# Patient Record
Sex: Female | Born: 1975 | Race: White | Hispanic: No | Marital: Married | Smoking: Never smoker
Health system: Southern US, Community
[De-identification: ages and names within clinical notes are randomized; demographics above are authoritative.]

## PROBLEM LIST (undated history)

## (undated) DIAGNOSIS — G43909 Migraine, unspecified, not intractable, without status migrainosus: Secondary | ICD-10-CM

---

## 1999-03-29 ENCOUNTER — Other Ambulatory Visit: Admission: RE | Admit: 1999-03-29 | Discharge: 1999-03-29 | Payer: Self-pay | Admitting: Family Medicine

## 2000-02-14 ENCOUNTER — Other Ambulatory Visit: Admission: RE | Admit: 2000-02-14 | Discharge: 2000-02-14 | Payer: Self-pay | Admitting: Obstetrics and Gynecology

## 2000-07-08 ENCOUNTER — Encounter: Payer: Self-pay | Admitting: Obstetrics and Gynecology

## 2000-07-08 ENCOUNTER — Ambulatory Visit (HOSPITAL_COMMUNITY): Admission: RE | Admit: 2000-07-08 | Discharge: 2000-07-08 | Payer: Self-pay | Admitting: Obstetrics and Gynecology

## 2000-08-01 ENCOUNTER — Encounter (INDEPENDENT_AMBULATORY_CARE_PROVIDER_SITE_OTHER): Payer: Self-pay | Admitting: Specialist

## 2000-08-01 ENCOUNTER — Other Ambulatory Visit: Admission: RE | Admit: 2000-08-01 | Discharge: 2000-08-01 | Payer: Self-pay | Admitting: Obstetrics and Gynecology

## 2003-01-30 ENCOUNTER — Emergency Department (HOSPITAL_COMMUNITY): Admission: AD | Admit: 2003-01-30 | Discharge: 2003-01-30 | Payer: Self-pay | Admitting: Family Medicine

## 2003-03-19 ENCOUNTER — Other Ambulatory Visit: Admission: RE | Admit: 2003-03-19 | Discharge: 2003-03-19 | Payer: Self-pay | Admitting: Obstetrics and Gynecology

## 2003-09-06 ENCOUNTER — Encounter: Admission: RE | Admit: 2003-09-06 | Discharge: 2003-09-06 | Payer: Self-pay | Admitting: Obstetrics and Gynecology

## 2003-09-22 ENCOUNTER — Inpatient Hospital Stay (HOSPITAL_COMMUNITY): Admission: AD | Admit: 2003-09-22 | Discharge: 2003-09-22 | Payer: Self-pay | Admitting: Obstetrics and Gynecology

## 2003-10-03 ENCOUNTER — Inpatient Hospital Stay (HOSPITAL_COMMUNITY): Admission: AD | Admit: 2003-10-03 | Discharge: 2003-10-06 | Payer: Self-pay | Admitting: Obstetrics and Gynecology

## 2004-01-07 ENCOUNTER — Encounter: Admission: RE | Admit: 2004-01-07 | Discharge: 2004-02-06 | Payer: Self-pay | Admitting: Obstetrics and Gynecology

## 2004-03-08 ENCOUNTER — Encounter: Admission: RE | Admit: 2004-03-08 | Discharge: 2004-04-07 | Payer: Self-pay | Admitting: Obstetrics and Gynecology

## 2004-04-08 ENCOUNTER — Encounter: Admission: RE | Admit: 2004-04-08 | Discharge: 2004-05-03 | Payer: Self-pay | Admitting: Obstetrics and Gynecology

## 2004-08-31 ENCOUNTER — Encounter: Admission: RE | Admit: 2004-08-31 | Discharge: 2004-08-31 | Payer: Self-pay | Admitting: Family Medicine

## 2005-02-01 ENCOUNTER — Other Ambulatory Visit: Admission: RE | Admit: 2005-02-01 | Discharge: 2005-02-01 | Payer: Self-pay | Admitting: Family Medicine

## 2006-02-25 ENCOUNTER — Encounter: Admission: RE | Admit: 2006-02-25 | Discharge: 2006-03-10 | Payer: Self-pay | Admitting: Family Medicine

## 2006-04-25 ENCOUNTER — Other Ambulatory Visit: Admission: RE | Admit: 2006-04-25 | Discharge: 2006-04-25 | Payer: Self-pay | Admitting: Family Medicine

## 2007-05-16 ENCOUNTER — Other Ambulatory Visit: Admission: RE | Admit: 2007-05-16 | Discharge: 2007-05-16 | Payer: Self-pay | Admitting: Family Medicine

## 2008-06-17 ENCOUNTER — Other Ambulatory Visit: Admission: RE | Admit: 2008-06-17 | Discharge: 2008-06-17 | Payer: Self-pay | Admitting: Family Medicine

## 2009-07-15 ENCOUNTER — Other Ambulatory Visit: Admission: RE | Admit: 2009-07-15 | Discharge: 2009-07-15 | Payer: Self-pay | Admitting: Family Medicine

## 2009-11-01 ENCOUNTER — Encounter
Admission: RE | Admit: 2009-11-01 | Discharge: 2009-11-01 | Payer: Self-pay | Source: Home / Self Care | Attending: Physical Medicine and Rehabilitation | Admitting: Physical Medicine and Rehabilitation

## 2010-07-07 NOTE — Discharge Summary (Signed)
Alyssa Combs, Alyssa Combs                         ACCOUNT NO.:  [id number]   MEDICAL RECORD NO.:  [id number]                   PATIENT TYPE:  INP   LOCATION:  9115                                 FACILITY:  [facility]   PHYSICIAN:  Maxie Better, M.D.            DATE OF BIRTH:  [birth year]   DATE OF ADMISSION:  10/03/2003  DATE OF DISCHARGE:  10/06/2003                                 DISCHARGE SUMMARY   ADMISSION DIAGNOSES:  1.  Spontaneous rupture of membranes.  2.  Intrauterine gestation at 59 and six-sevenths weeks.  3.  Iron deficiency anemia.  4.  Hypokalemia.  5.  Early labor.  6.  Pregnancy-induced hypertension.   DISCHARGE DIAGNOSES:  1.  Intrauterine gestation at 37 weeks, delivered.  2.  Hypokalemia.  3.  Pregnancy-induced hypertension.  4.  Iron deficiency anemia.   PROCEDURE:  Vaginal delivery, spontaneous.   HISTORY OF PRESENT ILLNESS:  A 35 year old gravida 1 para 0 female at 67 and  six-sevenths weeks admitted on October 03, 2003 after she presented with  spontaneous rupture of membranes in early labor.  Group B strep culture was  negative.  She had an uncomplicated prenatal course.  Ultrasound was notable  for large-for-gestational-age baby with an estimated fetal weight on her  last ultrasound was 6 pounds 9 ounces per the patient's report.  Blood type  is B positive, antibody screen was negative, rubella was immune, hepatitis B  surface antigen is negative.   HOSPITAL COURSE:  The patient was admitted.  Her blood pressure was noted to  be 160/87.  She was an anxious, gravid female.  Temperature was 98.2.  Her  exam was notable for abdomen gravid with mild palpable contractions, grossly  ruptured membranes, clear fluid.  Digital exam:  She was a tight 2 cm, 90%,  -2, vertex.  Extremities had trace edema.  Tracing showed baseline fetal  heart rate of 140, reactivity, variability, some decelerations, some  irregular contractions.  The patient was transferred to  labor and delivery.  PIH labs were obtained subsequently notable for hemoglobin of 10.1;  hematocrit 28.9; platelet count of 156,000.  Her potassium was 2.7, BUN was  3, and her uric acid was 3.1.  Potassium repletion was started.  The patient  subsequently received an epidural.  She reported a history of having some  vomiting.  No prior history of problems with a low potassium.  She  progressed well after epidural, pushed well, and subsequently had a vaginal  delivery of a live female over a small second degree perineal laceration,  Apgars of 8 and 9.  Weight of the baby was 7 pounds 6 ounces.  Cord around  the next x1 reducible.  Her lacerations were repaired.  She had increased  blood loss secondary to uterine atony.  This was treated with bimanual  massage, Pitocin dosage, Hemabate x2.  The placenta was intact.  The patient  reported history of  blood transfusion with the onset of menarche.  The  estimated blood loss was about 1000 mL.  Postpartum, the patient was not  able to tolerate the intravenous potassium replacement.  Oral potassium was  given.  She was started on iron supplementation twice a day.  Her postpartum  day #1 hemoglobin was 7.6; hematocrit 21.7; platelet count 133,000; white  count was 9.8.  Her blood pressure was slightly elevated, with range up to  141/91.  The patient had no other symptoms to suggest preeclampsia.  She had  subsequent blood pressures of 160s over 90s.  Her primary care obstetrician,  Dr. Annabell Howells, decided to have the patient remain in the hospital to watch her  blood pressures.  On October 06, 2003 with a repeat [facility] laboratory  essentially not that changed, her potassium increased to 3.2, the patient  was discharged home.   DISPOSITION:  Home.   CONDITION:  Stable.   DISCHARGE MEDICATIONS:  1.  K-Dur 20 mEq daily.  2.  She was started on labetalol 100 mg p.o. b.i.d.  with home health nurse      to check the blood pressure.  3.  Tylox one p.o.  q.6-8h. p.r.n. pain.  4.  Prenatal vitamins one p.o. daily.  5.  Iron supplementation one p.o. b.i.d.   FOLLOW-UP APPOINTMENT:  At [facility] for blood pressure check and  repeat potassium in a couple of days.  Otherwise, postpartum visit in 4-6  weeks.   DISCHARGE INSTRUCTIONS:  Per postpartum booklet given and PIH warning signs  reiterated.                                               Maxie Better, M.D.    Henry/MEDQ  D:  10/19/2003  T:  10/20/2003  Job:  [id number]

## 2010-10-16 ENCOUNTER — Other Ambulatory Visit (HOSPITAL_COMMUNITY)
Admission: RE | Admit: 2010-10-16 | Discharge: 2010-10-16 | Disposition: A | Discharge status: Home / Self Care | Payer: 59 | Source: Ambulatory Visit | Attending: Family Medicine | Admitting: Family Medicine

## 2010-10-16 ENCOUNTER — Other Ambulatory Visit: Payer: Self-pay | Admitting: Family Medicine

## 2010-10-16 DIAGNOSIS — Z124 Encounter for screening for malignant neoplasm of cervix: Secondary | ICD-10-CM | POA: Insufficient documentation

## 2013-04-07 ENCOUNTER — Emergency Department (HOSPITAL_BASED_OUTPATIENT_CLINIC_OR_DEPARTMENT_OTHER)
Admission: EM | Admit: 2013-04-07 | Discharge: 2013-04-07 | Disposition: A | Discharge status: Home / Self Care | Payer: 59 | Attending: Emergency Medicine | Admitting: Emergency Medicine

## 2013-04-07 ENCOUNTER — Encounter (HOSPITAL_BASED_OUTPATIENT_CLINIC_OR_DEPARTMENT_OTHER): Payer: Self-pay | Admitting: Emergency Medicine

## 2013-04-07 DIAGNOSIS — Y9389 Activity, other specified: Secondary | ICD-10-CM | POA: Insufficient documentation

## 2013-04-07 DIAGNOSIS — S335XXA Sprain of ligaments of lumbar spine, initial encounter: Secondary | ICD-10-CM | POA: Insufficient documentation

## 2013-04-07 DIAGNOSIS — Y929 Unspecified place or not applicable: Secondary | ICD-10-CM | POA: Insufficient documentation

## 2013-04-07 DIAGNOSIS — X500XXA Overexertion from strenuous movement or load, initial encounter: Secondary | ICD-10-CM | POA: Insufficient documentation

## 2013-04-07 DIAGNOSIS — T148XXA Other injury of unspecified body region, initial encounter: Secondary | ICD-10-CM

## 2013-04-07 MED ORDER — HYDROCODONE-ACETAMINOPHEN 5-325 MG PO TABS: 2.0000 | ORAL_TABLET | ORAL | Status: AC | PRN

## 2013-04-07 MED ORDER — METHOCARBAMOL 500 MG PO TABS: 500.0000 mg | ORAL_TABLET | Freq: Two times a day (BID) | ORAL | Status: AC

## 2013-04-07 NOTE — ED Notes (Signed)
Pt teary, states "she barely looked at me! How can she tell this is just a pulled muscle? I am not happy!" pt asked if she would like to speak with PA or MD again to voice her concerns, pt states "No. I just want to get out of here." pt walked to check out desk and to pharmacy by this rn, all questions answered, service recovery attempted.

## 2013-04-07 NOTE — Discharge Instructions (Signed)
Back Pain, Adult Low back pain is very common. About 1 in 5 people have back pain.The cause of low back pain is rarely dangerous. The pain often gets better over time.About half of people with a sudden onset of back pain feel better in just 2 weeks. About 8 in 10 people feel better by 6 weeks.  CAUSES Some common causes of back pain include:  Strain of the muscles or ligaments supporting the spine.  Wear and tear (degeneration) of the spinal discs.  Arthritis.  Direct injury to the back. DIAGNOSIS Most of the time, the direct cause of low back pain is not known.However, back pain can be treated effectively even when the exact cause of the pain is unknown.Answering your caregiver's questions about your overall health and symptoms is one of the most accurate ways to make sure the cause of your pain is not dangerous. If your caregiver needs more information, he or she may order lab work or imaging tests (X-rays or MRIs).However, even if imaging tests show changes in your back, this usually does not require surgery. HOME CARE INSTRUCTIONS For many people, back pain returns.Since low back pain is rarely dangerous, it is often a condition that people can learn to manageon their own.   Remain active. It is stressful on the back to sit or stand in one place. Do not sit, drive, or stand in one place for more than 30 minutes at a time. Take short walks on level surfaces as soon as pain allows.Try to increase the length of time you walk each day.  Do not stay in bed.Resting more than 1 or 2 days can delay your recovery.  Do not avoid exercise or work.Your body is made to move.It is not dangerous to be active, even though your back may hurt.Your back will likely heal faster if you return to being active before your pain is gone.  Pay attention to your body when you bend and lift. Many people have less discomfortwhen lifting if they bend their knees, keep the load close to their bodies,and  avoid twisting. Often, the most comfortable positions are those that put less stress on your recovering back.  Find a comfortable position to sleep. Use a firm mattress and lie on your side with your knees slightly bent. If you lie on your back, put a pillow under your knees.  Only take over-the-counter or prescription medicines as directed by your caregiver. Over-the-counter medicines to reduce pain and inflammation are often the most helpful.Your caregiver may prescribe muscle relaxant drugs.These medicines help dull your pain so you can more quickly return to your normal activities and healthy exercise.  Put ice on the injured area.  Put ice in a plastic bag.  Place a towel between your skin and the bag.  Leave the ice on for 15-20 minutes, 03-04 times a day for the first 2 to 3 days. After that, ice and heat may be alternated to reduce pain and spasms.  Ask your caregiver about trying back exercises and gentle massage. This may be of some benefit.  Avoid feeling anxious or stressed.Stress increases muscle tension and can worsen back pain.It is important to recognize when you are anxious or stressed and learn ways to manage it.Exercise is a great option. SEEK MEDICAL CARE IF:  You have pain that is not relieved with rest or medicine.  You have pain that does not improve in 1 week.  You have new symptoms.  You are generally not feeling well. SEEK   IMMEDIATE MEDICAL CARE IF:   You have pain that radiates from your back into your legs.  You develop new bowel or bladder control problems.  You have unusual weakness or numbness in your arms or legs.  You develop nausea or vomiting.  You develop abdominal pain.  You feel faint. Document Released: 02/05/2005 Document Revised: 08/07/2011 Document Reviewed: 06/26/2010 [employer] Patient Information 2014 [employer].  

## 2013-04-07 NOTE — ED Notes (Signed)
Back pain. She was putting away groceries and had a pop in her lower back. Has been using a heating pad with not much relief.

## 2013-04-07 NOTE — ED Provider Notes (Signed)
CSN: [id number]     Arrival date & time 04/07/13  1352 History   First MD Initiated Contact with Patient 04/07/13 1540     Chief Complaint  Patient presents with  . Back Pain     (Consider location/radiation/quality/duration/timing/severity/associated sxs/prior Treatment) Patient is a 38 y.o. female presenting with back pain. The history is provided by the patient. No language interpreter was used.  Back Pain Location:  Lumbar spine Quality:  Aching Radiates to:  Does not radiate Pain severity:  Moderate Pain is:  Same all the time Onset quality:  Sudden Duration:  1 day Timing:  Constant Progression:  Worsening Chronicity:  New Relieved by:  Nothing Worsened by:  Nothing tried Ineffective treatments:  Ibuprofen and heating pad Associated symptoms: no abdominal pain, no numbness and no tingling     History reviewed. No pertinent past medical history. History reviewed. No pertinent past surgical history. No family history on file. History  Substance Use Topics  . Smoking status: Never Smoker   . Smokeless tobacco: Not on file  . Alcohol Use: No   OB History   Grav Para Term Preterm Abortions TAB SAB Ect Mult Living                 Review of Systems  Gastrointestinal: Negative for abdominal pain.  Musculoskeletal: Positive for back pain.  Neurological: Negative for tingling and numbness.  All other systems reviewed and are negative.      Allergies  Percocet  Home Medications  No current outpatient prescriptions on file. BP 116/85  Pulse 120  Temp(Src) 98 F (36.7 C) (Oral)  SpO2 99%  LMP 03/17/2013 Physical Exam  Nursing note and vitals reviewed. Constitutional: She is oriented to person, place, and time. She appears well-developed and well-nourished.  HENT:  Head: Normocephalic.  Eyes: EOM are normal.  Neck: Normal range of motion.  Pulmonary/Chest: Effort normal.  Abdominal: She exhibits no distension.  Musculoskeletal: She exhibits tenderness.   Tender left lower back sacral,  Tender to palpation pain with range of motion  Neurological: She is alert and oriented to person, place, and time.  Psychiatric: She has a normal mood and affect.    ED Course  Procedures (including critical care time) Labs Review Labs Reviewed - No data to display Imaging Review No results found.  EKG Interpretation   None       MDM   Final diagnoses:  Muscle strain    Robaxin and hydrocodone    Elson AreasLeslie K Sofia, PA-C 04/07/13 1614

## 2013-04-07 NOTE — ED Notes (Signed)
PA at bedside.

## 2013-04-08 NOTE — ED Provider Notes (Signed)
History/physical exam/procedure(s) were performed by non-physician practitioner and as supervising physician I was immediately available for consultation/collaboration. I have reviewed all notes and am in agreement with care and plan.   Landis Cassaro S Robert Sperl, MD 04/08/13 1309 

## 2016-12-28 ENCOUNTER — Emergency Department (HOSPITAL_BASED_OUTPATIENT_CLINIC_OR_DEPARTMENT_OTHER)
Admission: EM | Admit: 2016-12-28 | Discharge: 2016-12-28 | Disposition: A | Discharge status: Home / Self Care | Attending: Emergency Medicine | Admitting: Emergency Medicine

## 2016-12-28 ENCOUNTER — Emergency Department (HOSPITAL_BASED_OUTPATIENT_CLINIC_OR_DEPARTMENT_OTHER)

## 2016-12-28 ENCOUNTER — Other Ambulatory Visit: Payer: Self-pay

## 2016-12-28 ENCOUNTER — Encounter (HOSPITAL_BASED_OUTPATIENT_CLINIC_OR_DEPARTMENT_OTHER): Payer: Self-pay | Admitting: Adult Health

## 2016-12-28 DIAGNOSIS — S161XXA Strain of muscle, fascia and tendon at neck level, initial encounter: Secondary | ICD-10-CM | POA: Diagnosis not present

## 2016-12-28 DIAGNOSIS — Z79899 Other long term (current) drug therapy: Secondary | ICD-10-CM | POA: Insufficient documentation

## 2016-12-28 DIAGNOSIS — S0083XA Contusion of other part of head, initial encounter: Secondary | ICD-10-CM | POA: Insufficient documentation

## 2016-12-28 DIAGNOSIS — S0990XA Unspecified injury of head, initial encounter: Secondary | ICD-10-CM | POA: Insufficient documentation

## 2016-12-28 DIAGNOSIS — Y929 Unspecified place or not applicable: Secondary | ICD-10-CM | POA: Insufficient documentation

## 2016-12-28 DIAGNOSIS — W108XXA Fall (on) (from) other stairs and steps, initial encounter: Secondary | ICD-10-CM | POA: Diagnosis not present

## 2016-12-28 DIAGNOSIS — Y990 Civilian activity done for income or pay: Secondary | ICD-10-CM | POA: Diagnosis not present

## 2016-12-28 DIAGNOSIS — Y9389 Activity, other specified: Secondary | ICD-10-CM | POA: Insufficient documentation

## 2016-12-28 HISTORY — DX: Migraine, unspecified, not intractable, without status migrainosus: G43.909

## 2016-12-28 MED ORDER — METAXALONE 800 MG PO TABS: 800.0000 mg | ORAL_TABLET | Freq: Three times a day (TID) | ORAL | 0 refills | Status: AC | PRN

## 2016-12-28 MED ORDER — ACETAMINOPHEN 325 MG PO TABS
650.0000 mg | ORAL_TABLET | Freq: Once | ORAL | Status: AC
  Administered 2016-12-28: 650 mg via ORAL
  Filled 2016-12-28: qty 2

## 2016-12-28 NOTE — ED Triage Notes (Signed)
PResents post fall with head leading into a brick wall from tripping over uneven pavement. Senies LOC. C/o severe c-spine pain. Denies numbness and tingling. MAE. Soft c-collar applied. 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

## 2016-12-28 NOTE — ED Provider Notes (Signed)
[facility] EMERGENCY DEPARTMENT Provider Note   CSN: [id number] Arrival date & time: 12/28/16  1554     History   Chief Complaint Chief Complaint  Patient presents with  . Head Injury    HPI Lennox SoldersBeverly Cordner is a 41 y.o. female.  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  Was walking up some brick stairs and tripped landing forward onto her forehead.  Had some head and neck pain at the time.  No numbness or weakness.  Swelling on her right forehead.  Happened around 2 hours prior to arrival.  Pain is in her upper neck.  No loss of consciousness.  No numbness or weakness in her arms or legs.  Still has been able to ambulate. Past Medical History:  Diagnosis Date  . Migraines     There are no active problems to display for this patient.   History reviewed. No pertinent surgical history.  OB History    No data available       Home Medications    Prior to Admission medications   Medication Sig Start Date End Date Taking? Authorizing Provider  HYDROcodone-acetaminophen (NORCO/VICODIN) 5-325 MG per tablet Take 2 tablets by mouth every 4 (four) hours as needed. 04/07/13   Elson AreasSofia, Leslie K, PA-C  metaxalone (SKELAXIN) 800 MG tablet Take 1 tablet (800 mg total) 3 (three) times daily as needed by mouth for muscle spasms. 12/28/16   Benjiman CorePickering, Tramane Gorum, MD  methocarbamol (ROBAXIN) 500 MG tablet Take 1 tablet (500 mg total) by mouth 2 (two) times daily. 04/07/13   Elson AreasSofia, Leslie K, PA-C    Family History History reviewed. No pertinent family history.  Social History Social History   Tobacco Use  . Smoking status: Never Smoker  Substance Use Topics  . Alcohol use: No  . Drug use: No     Allergies   Percocet [oxycodone-acetaminophen]   Review of Systems Review of Systems  Constitutional: Negative for chills and fever.  HENT: Negative for congestion.   Respiratory: Negative for cough.   Cardiovascular: Negative for chest pain.  Gastrointestinal: Negative for  abdominal pain.  Genitourinary: Negative for flank pain.  Musculoskeletal: Positive for neck pain.  Skin: Negative for rash.  Neurological: Positive for headaches. Negative for weakness and numbness.  Psychiatric/Behavioral: Negative for confusion.     Physical Exam Updated Vital Signs BP (!) 132/92 (BP Location: Right Arm)   Pulse 73   Temp 98.6 F (37 C) (Oral)   Resp 16   Ht 5\' 5"  (1.651 m)   Wt 87.1 kg (192 lb)   LMP 12/04/2016 (Exact Date)   SpO2 100%   BMI 31.95 kg/m   Physical Exam  Constitutional: She is oriented to person, place, and time. She appears well-developed.  HENT:  Head: Normocephalic.  Hematoma to right forehead  Eyes: Pupils are equal, round, and reactive to light.  Neck:  Tenderness to upper cervical spine.  Cervical collar in place.  Pulmonary/Chest: Effort normal.  Abdominal: Soft. There is no tenderness.  Neurological: She is alert and oriented to person, place, and time.  Good grip strength bilaterally.  Skin: Skin is warm. Capillary refill takes less than 2 seconds.  Psychiatric: She has a normal mood and affect.     ED Treatments / Results  Labs (all labs ordered are listed, but only abnormal results are displayed) Labs Reviewed - No data to display  EKG  EKG Interpretation None       Radiology Ct Head Wo Contrast  Result  Date: 12/28/2016 CLINICAL DATA:  Status post fall with forehead injury and neck pain. EXAM: CT HEAD WITHOUT CONTRAST CT CERVICAL SPINE WITHOUT CONTRAST TECHNIQUE: Multidetector CT imaging of the head and cervical spine was performed following the standard protocol without intravenous contrast. Multiplanar CT image reconstructions of the cervical spine were also generated. COMPARISON:  None. FINDINGS: CT HEAD FINDINGS Brain: No evidence of acute infarction, hemorrhage, hydrocephalus, extra-axial collection or mass lesion/mass effect. Vascular: No hyperdense vessel or unexpected calcification. Skull: Normal. Negative  for fracture or focal lesion. Sinuses/Orbits: No acute finding. Other: Right frontal scalp hematoma. CT CERVICAL SPINE FINDINGS Alignment: Straightening of cervical lordosis, likely positional. Skull base and vertebrae: No acute fracture. No primary bone lesion or focal pathologic process. Soft tissues and spinal canal: No prevertebral fluid or swelling. No visible canal hematoma. Disc levels:  Normal. Upper chest: Negative. Other: None. IMPRESSION: No acute intracranial abnormality. No evidence of acute traumatic injury to the cervical spine. Electronically Signed   By: Ted Mcalpineobrinka  Dimitrova M.D.   On: 12/28/2016 17:01   Ct Cervical Spine Wo Contrast  Result Date: 12/28/2016 CLINICAL DATA:  Status post fall with forehead injury and neck pain. EXAM: CT HEAD WITHOUT CONTRAST CT CERVICAL SPINE WITHOUT CONTRAST TECHNIQUE: Multidetector CT imaging of the head and cervical spine was performed following the standard protocol without intravenous contrast. Multiplanar CT image reconstructions of the cervical spine were also generated. COMPARISON:  None. FINDINGS: CT HEAD FINDINGS Brain: No evidence of acute infarction, hemorrhage, hydrocephalus, extra-axial collection or mass lesion/mass effect. Vascular: No hyperdense vessel or unexpected calcification. Skull: Normal. Negative for fracture or focal lesion. Sinuses/Orbits: No acute finding. Other: Right frontal scalp hematoma. CT CERVICAL SPINE FINDINGS Alignment: Straightening of cervical lordosis, likely positional. Skull base and vertebrae: No acute fracture. No primary bone lesion or focal pathologic process. Soft tissues and spinal canal: No prevertebral fluid or swelling. No visible canal hematoma. Disc levels:  Normal. Upper chest: Negative. Other: None. IMPRESSION: No acute intracranial abnormality. No evidence of acute traumatic injury to the cervical spine. Electronically Signed   By: Ted Mcalpineobrinka  Dimitrova M.D.   On: 12/28/2016 17:01    Procedures Procedures  (including critical care time)  Medications Ordered in ED Medications  acetaminophen (TYLENOL) tablet 650 mg (650 mg Oral Given 12/28/16 1750)     Initial Impression / Assessment and Plan / ED Course  I have reviewed the triage vital signs and the nursing notes.  Pertinent labs & imaging results that were available during my care of the patient were reviewed by me and considered in my medical decision making (see chart for details).     Patient with fall.  Hematoma to forehead.  Neck pain.  CT reassuring.  Painless range of motion with cervical collar removed.  Will discharge home.  Final Clinical Impressions(s) / ED Diagnoses   Final diagnoses:  Minor head injury, initial encounter  Contusion of face, initial encounter  Cervical strain, acute, initial encounter    ED Discharge Orders        Ordered    metaxalone (SKELAXIN) 800 MG tablet  3 times daily PRN     12/28/16 1806       Benjiman CorePickering, Raziyah Vanvleck, MD 12/28/16 2304

## 2016-12-28 NOTE — ED Notes (Signed)
EDP at bedside; collar removed.

## 2016-12-28 NOTE — ED Notes (Signed)
Patient transported to CT 

## 2017-01-01 ENCOUNTER — Other Ambulatory Visit: Payer: Self-pay

## 2017-01-01 ENCOUNTER — Emergency Department (HOSPITAL_BASED_OUTPATIENT_CLINIC_OR_DEPARTMENT_OTHER)
Admission: EM | Admit: 2017-01-01 | Discharge: 2017-01-01 | Disposition: A | Discharge status: Home / Self Care | Payer: 59 | Attending: Physician Assistant | Admitting: Physician Assistant

## 2017-01-01 ENCOUNTER — Encounter (HOSPITAL_BASED_OUTPATIENT_CLINIC_OR_DEPARTMENT_OTHER): Payer: Self-pay | Admitting: Emergency Medicine

## 2017-01-01 DIAGNOSIS — S060X0D Concussion without loss of consciousness, subsequent encounter: Secondary | ICD-10-CM | POA: Insufficient documentation

## 2017-01-01 DIAGNOSIS — W0110XA Fall on same level from slipping, tripping and stumbling with subsequent striking against unspecified object, initial encounter: Secondary | ICD-10-CM | POA: Insufficient documentation

## 2017-01-01 DIAGNOSIS — Z79899 Other long term (current) drug therapy: Secondary | ICD-10-CM | POA: Insufficient documentation

## 2017-01-01 NOTE — ED Triage Notes (Signed)
Pt was seen here after falling on Friday and hitting her head on the floor. Pt reports her work note has her returning to work tomorrow but pt reports she still has some head pain and dizziness with movement.

## 2017-01-01 NOTE — ED Provider Notes (Signed)
[facility] EMERGENCY DEPARTMENT Provider Note   CSN: [id number] Arrival date & time: 01/01/17  0932     History   Chief Complaint Chief Complaint  Patient presents with  . Follow up from fall    HPI Lennox SoldersBeverly Erck is a 41 y.o. female.  HPI   Patient is a 41 year old female presenting for second visit after a fall.  Patient was seen by a fall several days ago.  Negative CT head and neck.  Patient still having headaches and dizziness.  She feels that she cannot continue to work.  Patient has mostly positional dizziness with occasional low-grade headaches.  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  We will have her rest, and follow-up with her primary care physician.  Past Medical History:  Diagnosis Date  . Migraines     There are no active problems to display for this patient.   History reviewed. No pertinent surgical history.  OB History    No data available       Home Medications    Prior to Admission medications   Medication Sig Start Date End Date Taking? Authorizing Provider  HYDROcodone-acetaminophen (NORCO/VICODIN) 5-325 MG per tablet Take 2 tablets by mouth every 4 (four) hours as needed. 04/07/13   Elson AreasSofia, Leslie K, PA-C  metaxalone (SKELAXIN) 800 MG tablet Take 1 tablet (800 mg total) 3 (three) times daily as needed by mouth for muscle spasms. 12/28/16   Benjiman CorePickering, Nathan, MD  methocarbamol (ROBAXIN) 500 MG tablet Take 1 tablet (500 mg total) by mouth 2 (two) times daily. 04/07/13   Elson AreasSofia, Leslie K, PA-C    Family History No family history on file.  Social History Social History   Tobacco Use  . Smoking status: Never Smoker  . Smokeless tobacco: Never Used  Substance Use Topics  . Alcohol use: No  . Drug use: No     Allergies   Percocet [oxycodone-acetaminophen]   Review of Systems Review of Systems  Constitutional: Negative for activity change.  Respiratory: Negative for shortness of breath.   Cardiovascular: Negative  for chest pain.  Gastrointestinal: Negative for abdominal pain.  Neurological: Positive for light-headedness and headaches.  All other systems reviewed and are negative.    Physical Exam Updated Vital Signs BP 133/87 (BP Location: Right Arm)   Pulse 97   Temp 98.5 F (36.9 C) (Oral)   Resp 18   Ht 5\' 5"  (1.651 m)   Wt 87.1 kg (192 lb)   LMP 12/04/2016 (Exact Date)   SpO2 97%   BMI 31.95 kg/m   Physical Exam  Constitutional: She is oriented to person, place, and time. She appears well-developed and well-nourished.  HENT:  Head: Normocephalic and atraumatic.  Eyes: Right eye exhibits no discharge.  Cardiovascular: Normal rate, regular rhythm and normal heart sounds.  No murmur heard. Pulmonary/Chest: Effort normal and breath sounds normal. She has no wheezes. She has no rales.  Abdominal: Soft. She exhibits no distension. There is no tenderness.  Neurological: She is oriented to person, place, and time. No cranial nerve deficit. Coordination normal.  Equal strength bilaterally upper and lower extremities negative pronator drift. Normal sensation bilaterally. Speech comprehensible, no slurring. Facial nerve tested and appears grossly normal. Alert and oriented 3.   Skin: Skin is warm and dry. She is not diaphoretic.  Psychiatric: She has a normal mood and affect.  Nursing note and vitals reviewed.    ED Treatments / Results  Labs (all labs ordered are listed, but only abnormal  results are displayed) Labs Reviewed - No data to display  EKG  EKG Interpretation None       Radiology No results found.  Procedures Procedures (including critical care time)  Medications Ordered in ED Medications - No data to display   Initial Impression / Assessment and Plan / ED Course  I have reviewed the triage vital signs and the nursing notes.  Pertinent labs & imaging results that were available during my care of the patient were reviewed by me and considered in my medical  decision making (see chart for details).     Patient is a 41 year old female presenting for second visit after a fall.  Patient was seen by a fall several days ago.  Negative CT head and neck.  Patient still having headaches and dizziness.  She feels that she cannot continue to work.  Patient has mostly positional dizziness with occasional low-grade headaches.  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  We will have her rest, and follow-up with her primary care physician.    Final Clinical Impressions(s) / ED Diagnoses   Final diagnoses:  Concussion without loss of consciousness, subsequent encounter    ED Discharge Orders    None       Laquentin Loudermilk, Cindee Saltourteney Lyn, MD 01/01/17 1000

## 2017-05-15 ENCOUNTER — Other Ambulatory Visit: Payer: Self-pay | Admitting: Family Medicine

## 2017-05-15 ENCOUNTER — Other Ambulatory Visit (HOSPITAL_COMMUNITY)
Admission: RE | Admit: 2017-05-15 | Discharge: 2017-05-15 | Disposition: A | Discharge status: Home / Self Care | Payer: 59 | Source: Ambulatory Visit | Attending: Family Medicine | Admitting: Family Medicine

## 2017-05-15 DIAGNOSIS — Z01411 Encounter for gynecological examination (general) (routine) with abnormal findings: Secondary | ICD-10-CM | POA: Insufficient documentation

## 2017-05-17 LAB — CYTOLOGY - PAP
Adequacy: ABSENT
Diagnosis: NEGATIVE
HPV (WINDOPATH): NOT DETECTED
